# Patient Record
Sex: Male | Born: 1970 | Race: Black or African American | Hispanic: No | Marital: Married | Smoking: Never smoker
Health system: Southern US, Community
[De-identification: ages and names within clinical notes are randomized; demographics above are authoritative.]

## PROBLEM LIST (undated history)

## (undated) DIAGNOSIS — E119 Type 2 diabetes mellitus without complications: Secondary | ICD-10-CM

## (undated) HISTORY — DX: Type 2 diabetes mellitus without complications: E11.9

---

## 2000-09-12 ENCOUNTER — Emergency Department (HOSPITAL_COMMUNITY): Admission: EM | Admit: 2000-09-12 | Discharge: 2000-09-12 | Payer: Self-pay | Admitting: Emergency Medicine

## 2000-09-12 ENCOUNTER — Encounter: Payer: Self-pay | Admitting: Emergency Medicine

## 2008-04-05 ENCOUNTER — Encounter: Admission: RE | Admit: 2008-04-05 | Discharge: 2008-04-05 | Payer: Self-pay | Admitting: Chiropractic Medicine

## 2009-10-22 IMAGING — CR DG LUMBAR SPINE 1V
1 series · 1 of 1 positions shown · non-contrast
Comparison: None

CLINICAL DATA: Lumbago

LUMBAR SPINE - 1 VIEW

[w l-spine lat *]
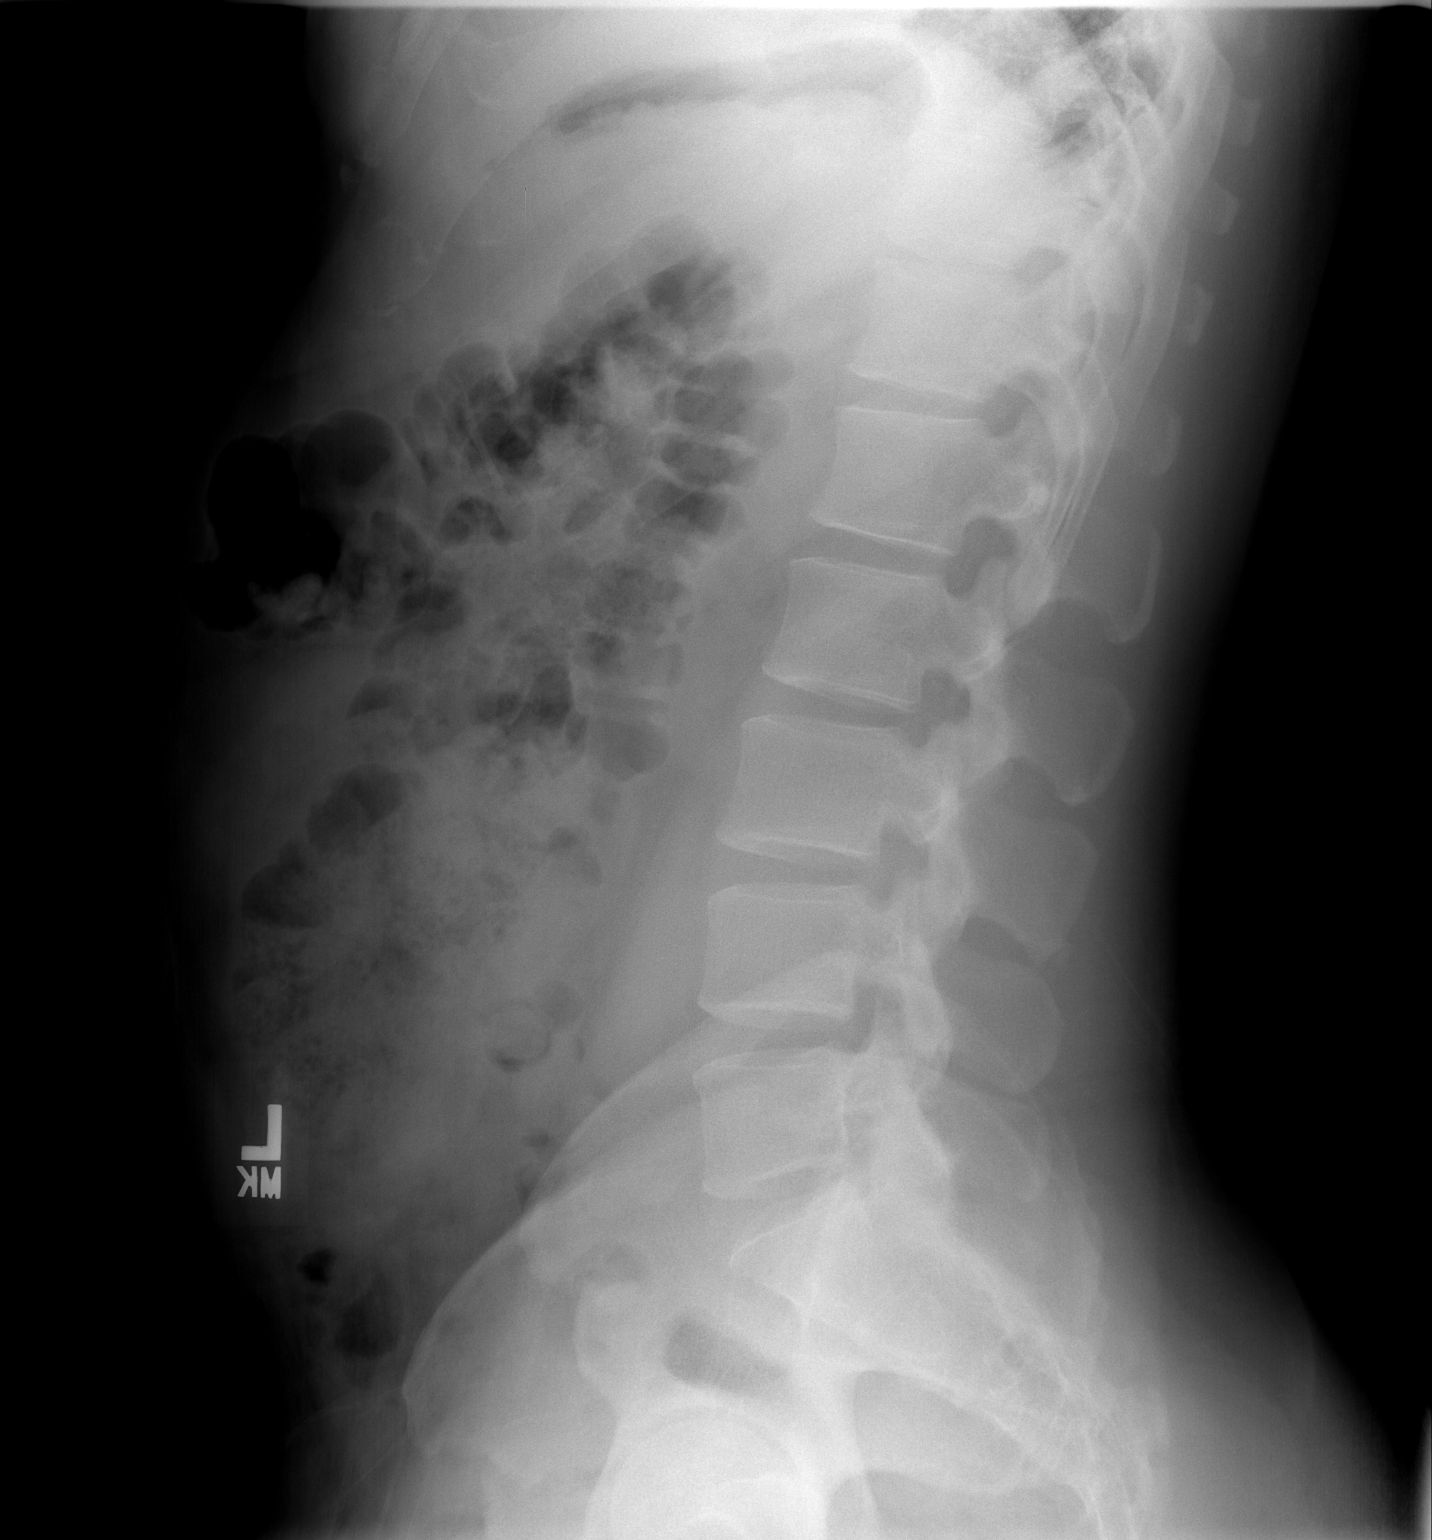

[1 of 1 positions shown; findings below may reference images not displayed]

FINDINGS: A single lateral view shows no subluxation or fractures.
Disc height preserved.  Soft tissues normal.
IMPRESSION: Normal lateral view of the lumbar spine

## 2017-04-29 ENCOUNTER — Encounter: Payer: Medicaid Other | Attending: Nurse Practitioner | Admitting: Dietician

## 2017-04-29 ENCOUNTER — Encounter: Payer: Self-pay | Admitting: Dietician

## 2017-04-29 DIAGNOSIS — Z713 Dietary counseling and surveillance: Secondary | ICD-10-CM | POA: Insufficient documentation

## 2017-04-29 DIAGNOSIS — E119 Type 2 diabetes mellitus without complications: Secondary | ICD-10-CM | POA: Insufficient documentation

## 2017-04-29 NOTE — Patient Instructions (Addendum)
Continue the great changes that you have made!  Continue to choose beverages without carbohydrates.  Continue to stay active.  Aim for 4 Carb Choices per meal (60 grams) +/- 1 either way  Aim for 0-2 Carbs per snack if hungry  Include protein in moderation with your meals and snacks Consider reading food labels for Total Carbohydrate and Fat Grams of foods Consider  increasing your activity level by walking or other things you enjoy for at least 30 minutes daily as tolerated Continue checking BG at alternate times per day as directed by MD  Continue taking medication as directed by MD

## 2017-04-29 NOTE — Progress Notes (Signed)
Diabetes Self-Management Education  Visit Type: First/Initial  Appt. Start Time: 11 Appt. End Time: 1245  04/29/2017  [name], identified by name and date of birth, is a 46 y.o. male with a diagnosis of Diabetes: Type 2.  Patient has been reading about diabetes and made changes.  He would like to know more about healthy eating with diabetes.  He states that his wife has prediabetes.  He has been checking his blood sugar and brought in his log today with numbers in the normal range at this time.  He has been experimenting with his food and then checking his blood sugar to see the effect of his choices.  He lives with his wife and 3 children ages 79,13,11.  He is self employed and does the shopping and cooking.  Most cooking is very simple.  He has been avoiding added salt and canned foods unless he rinses his vegetables.  ASSESSMENT  Height 5\' 7"  (1.702 m), weight 195 lb (88.5 kg). Body mass index is 30.54 kg/m.      Diabetes Self-Management Education - 04/29/17 1124      Visit Information   Visit Type First/Initial     Initial Visit   Diabetes Type Type 1   Are you currently following a meal plan? No   Are you taking your medications as prescribed? Yes   Date Diagnosed 02/2017     Health Coping   How would you rate your overall health? Good     Psychosocial Assessment   Patient Belief/Attitude about Diabetes Motivated to manage diabetes   Self-care barriers None   Self-management support Doctor's office;Family   Other persons present Patient   Patient Concerns Nutrition/Meal planning   Special Needs None   Preferred Learning Style No preference indicated   Learning Readiness Ready   How often do you need to have someone help you when you read instructions, pamphlets, or other written materials from your doctor or pharmacy? 1 - Never   What is the last grade level you completed in school? 2 years college     Pre-Education Assessment   Patient understands the  diabetes disease and treatment process. Needs Instruction   Patient understands incorporating nutritional management into lifestyle. Needs Instruction   Patient undertands incorporating physical activity into lifestyle. Needs Instruction   Patient understands using medications safely. Needs Instruction   Patient understands monitoring blood glucose, interpreting and using results Needs Instruction   Patient understands prevention, detection, and treatment of acute complications. Needs Instruction   Patient understands prevention, detection, and treatment of chronic complications. Needs Instruction   Patient understands how to develop strategies to address psychosocial issues. Needs Instruction   Patient understands how to develop strategies to promote health/change behavior. Needs Instruction     Complications   Last HgB A1C per patient/outside source 14 %  02/2017   How often do you check your blood sugar? 3-4 times/day  but currently ran out of strips   Fasting Blood glucose range (mg/dL) 16-109   Postprandial Blood glucose range (mg/dL) 60-454;098-119   Number of hypoglycemic episodes per month 0   Number of hyperglycemic episodes per week 0   Have you had a dilated eye exam in the past 12 months? Yes   Have you had a dental exam in the past 12 months? No   Are you checking your feet? Yes   How many days per week are you checking your feet? 5     Dietary Intake   Breakfast  egg whites, bacon, OR skips  7:30-8:30   Lunch Salad (vegetables, 1 strip bacon, egg whites, lite ranch) or fish sandwich          11:30-1   Dinner Chili OR spaghetti OR salad OR [place] burgers or hot dog with baked fries  5-6   Snack (evening) peanuts OR cheese its OR dried pineapple OR honey wheat crackers   Beverage(s) water, v-8 juice (vegetable or fruit), half and half tea     Exercise   Exercise Type Light (walking / raking leaves)   How many days per week to you exercise? 3   How many minutes per day  do you exercise? 30   Total minutes per week of exercise 90     Patient Education   Previous Diabetes Education No  Patient states that his mother and mother in law both have diabetes.   Disease state  Definition of diabetes, type 1 and 2, and the diagnosis of diabetes   Nutrition management  Role of diet in the treatment of diabetes and the relationship between the three main macronutrients and blood glucose level;Food label reading, portion sizes and measuring food.;Meal options for control of blood glucose level and chronic complications.;Information on hints to eating out and maintain blood glucose control.;Meal timing in regards to the patients' current diabetes medication.   Physical activity and exercise  Role of exercise on diabetes management, blood pressure control and cardiac health.   Medications Reviewed patients medication for diabetes, action, purpose, timing of dose and side effects.   Monitoring Identified appropriate SMBG and/or A1C goals.;Purpose and frequency of SMBG.;Other (comment)  Problem solving for testing   Acute complications Taught treatment of hypoglycemia - the 15 rule.;Discussed and identified patients' treatment of hyperglycemia.   Chronic complications Relationship between chronic complications and blood glucose control   Psychosocial adjustment Worked with patient to identify barriers to care and solutions   Personal strategies to promote health Lifestyle issues that need to be addressed for better diabetes care     Individualized Goals (developed by patient)   Nutrition General guidelines for healthy choices and portions discussed   Physical Activity Exercise 5-7 days per week;30 minutes per day   Medications take my medication as prescribed   Monitoring  test my blood glucose as discussed   Reducing Risk get labs drawn;increase portions of healthy fats   Health Coping discuss diabetes with (comment)  MD, RD     Post-Education Assessment   Patient  understands the diabetes disease and treatment process. Demonstrates understanding / competency   Patient understands incorporating nutritional management into lifestyle. Needs Review   Patient undertands incorporating physical activity into lifestyle. Needs Review   Patient understands using medications safely. Demonstrates understanding / competency   Patient understands monitoring blood glucose, interpreting and using results Demonstrates understanding / competency   Patient understands prevention, detection, and treatment of acute complications. Demonstrates understanding / competency   Patient understands prevention, detection, and treatment of chronic complications. Demonstrates understanding / competency   Patient understands how to develop strategies to address psychosocial issues. Demonstrates understanding / competency   Patient understands how to develop strategies to promote health/change behavior. Needs Review     Outcomes   Expected Outcomes Demonstrated interest in learning. Expect positive outcomes   Future DMSE 4-6 wks      Individualized Plan for Diabetes Self-Management Training:   Learning Objective:  Patient will have a greater understanding of diabetes self-management. Patient education plan is to attend individual and/or  group sessions per assessed needs and concerns.   Plan:   Patient Instructions  Continue the great changes that you have made!  Continue to choose beverages without carbohydrates.  Continue to stay active.  Aim for 4 Carb Choices per meal (60 grams) +/- 1 either way  Aim for 0-2 Carbs per snack if hungry  Include protein in moderation with your meals and snacks Consider reading food labels for Total Carbohydrate and Fat Grams of foods Consider  increasing your activity level by walking or other things you enjoy for at least 30 minutes daily as tolerated Continue checking BG at alternate times per day as directed by MD  Continue taking  medication as directed by MD      Expected Outcomes:  Demonstrated interest in learning. Expect positive outcomes  Education material provided: Living Well with Diabetes, Food label handouts, A1C conversion sheet, Meal plan card, My Plate and Snack sheet  If problems or questions, patient to contact team via:  Phone  Future DSME appointment: 4-6 wks

## 2017-05-27 ENCOUNTER — Encounter: Payer: Medicaid Other | Attending: Nurse Practitioner | Admitting: Dietician

## 2017-05-27 ENCOUNTER — Encounter: Payer: Self-pay | Admitting: Dietician

## 2017-05-27 DIAGNOSIS — E119 Type 2 diabetes mellitus without complications: Secondary | ICD-10-CM | POA: Insufficient documentation

## 2017-05-27 DIAGNOSIS — Z713 Dietary counseling and surveillance: Secondary | ICD-10-CM | POA: Insufficient documentation

## 2017-05-27 DIAGNOSIS — E118 Type 2 diabetes mellitus with unspecified complications: Secondary | ICD-10-CM

## 2017-05-27 DIAGNOSIS — IMO0002 Reserved for concepts with insufficient information to code with codable children: Secondary | ICD-10-CM

## 2017-05-27 DIAGNOSIS — Z794 Long term (current) use of insulin: Secondary | ICD-10-CM

## 2017-05-27 DIAGNOSIS — E1165 Type 2 diabetes mellitus with hyperglycemia: Secondary | ICD-10-CM

## 2017-05-27 NOTE — Patient Instructions (Signed)
Avoid skipping meals.  Aim for breakfast, lunch, dinner daily. Small snack when you are hungry. Be sure to have carbohydrate and protein with each meal.  Aim for 4 Carb Choices per meal (60 grams) +/- 1 either way  Aim for 0-2 Carbs per snack if hungry  Include protein in moderation with your meals and snacks Consider reading food labels for Total Carbohydrate and Fat Grams of foods Consider  increasing your activity level by walking or other for 30 minutes daily as tolerated Continue checking BG at alternate times per day as directed by MD  Continue taking medication as directed by MD

## 2017-05-27 NOTE — Progress Notes (Signed)
Diabetes Self-Management Education  Visit Type: Follow-up  Appt. Start Time: 1215 Appt. End Time: 1255  05/27/2017  [name], identified by name and date of birth, is a 46 y.o. male with a diagnosis of Diabetes: Type 2.  Patient has been reading about diabetes and made changes.  He would like to know more about healthy eating with diabetes.  He states that his wife has prediabetes.  He has been checking his blood sugar and brought in his log today with numbers in the normal range at this time.  He has been experimenting with his food and then checking his blood sugar to see the effect of his choices.  He lives with his wife and 3 children ages 6217,13,11.  He is self employed and does the shopping and cooking.  Most cooking is very simple.  He has been avoiding added salt and canned foods unless he rinses his vegetables.  Patient was going to bring his wife today but she was not feeling well.  He states that his energy is low and his wife has been encouraging him to eat more carbohydrates.  His diet generally is lower in carbohydrate.  He will cook for the family but will not eat it and just eat a salad.  Review of his blood sugar low indicates overall good control and any highs that patient has, patient is able to determine the cause related to what he was eating and the effect on his blood sugar.  He is taking 25 units of Tresiba q HS and states that it has been reduced.  ASSESSMENT  197 lbs today.  His weight is overall stable.       Diabetes Self-Management Education - 05/27/17 1431      Visit Information   Visit Type Follow-up     Initial Visit   Diabetes Type Type 2   Are you currently following a meal plan? Yes   Are you taking your medications as prescribed? Yes   Date Diagnosed 02/2017     Health Coping   How would you rate your overall health? Good     Psychosocial Assessment   Patient Belief/Attitude about Diabetes Motivated to manage diabetes   Self-care  barriers None   Self-management support Doctor's office;Family   Other persons present Patient   Patient Concerns Nutrition/Meal planning;Glycemic Control   Special Needs None   Preferred Learning Style No preference indicated   Learning Readiness Ready   How often do you need to have someone help you when you read instructions, pamphlets, or other written materials from your doctor or pharmacy? 1 - Never   What is the last grade level you completed in school? 2 years college     Pre-Education Assessment   Patient understands the diabetes disease and treatment process. Demonstrates understanding / competency   Patient understands incorporating nutritional management into lifestyle. Needs Review   Patient undertands incorporating physical activity into lifestyle. Demonstrates understanding / competency   Patient understands using medications safely. Demonstrates understanding / competency   Patient understands monitoring blood glucose, interpreting and using results Demonstrates understanding / competency   Patient understands prevention, detection, and treatment of acute complications. Demonstrates understanding / competency   Patient understands prevention, detection, and treatment of chronic complications. Demonstrates understanding / competency   Patient understands how to develop strategies to address psychosocial issues. Needs Review   Patient understands how to develop strategies to promote health/change behavior. Needs Review     Complications   Last HgB  A1C per patient/outside source 14 %  02/2017   How often do you check your blood sugar? 3-4 times/day   Fasting Blood glucose range (mg/dL) 96-045   Postprandial Blood glucose range (mg/dL) 40-981;191-478   Number of hypoglycemic episodes per month 0   Number of hyperglycemic episodes per week 1   Have you had a dilated eye exam in the past 12 months? Yes     Dietary Intake   Breakfast salad with veges and chees, pear OR egg  whites, fruit, 2 strips bacon and 1/2 cup OJ or milk  skipping breakfast less often.   Snack (morning) none   Lunch salad with protein OR burger king (2 cheeseburgers)  skips lunch more often when he eats breakfast   Snack (afternoon) regular popsicles on occasion   Dinner salad and chlli   Snack (evening) peanuts or cheese or fruit or crackers   Beverage(s) water     Exercise   Exercise Type Light (walking / raking leaves)   How many days per week to you exercise? 6   How many minutes per day do you exercise? 30   Total minutes per week of exercise 180     Patient Education   Previous Diabetes Education Yes (please comment)  2 months ago   Nutrition management  Role of diet in the treatment of diabetes and the relationship between the three main macronutrients and blood glucose level;Food label reading, portion sizes and measuring food.;Meal options for control of blood glucose level and chronic complications.;Information on hints to eating out and maintain blood glucose control.   Physical activity and exercise  Role of exercise on diabetes management, blood pressure control and cardiac health.   Psychosocial adjustment Worked with patient to identify barriers to care and solutions;Role of stress on diabetes;Identified and addressed patients feelings and concerns about diabetes   Personal strategies to promote health Lifestyle issues that need to be addressed for better diabetes care     Individualized Goals (developed by patient)   Nutrition General guidelines for healthy choices and portions discussed   Physical Activity Exercise 5-7 days per week;30 minutes per day   Medications take my medication as prescribed   Monitoring  test my blood glucose as discussed   Problem Solving ways to increase carbohydrates, balanced meals without boredom   Reducing Risk examine blood glucose patterns;get labs drawn;do foot checks daily   Health Coping discuss diabetes with (comment)  MD/RD      Patient Self-Evaluation of Goals - Patient rates self as meeting previously set goals (% of time)   Nutrition >75%   Physical Activity >75%   Medications >75%   Monitoring >75%   Problem Solving 25 - 50%   Reducing Risk 50 - 75 %   Health Coping 50 - 75 %     Post-Education Assessment   Patient understands the diabetes disease and treatment process. Demonstrates understanding / competency   Patient understands incorporating nutritional management into lifestyle. Demonstrates understanding / competency   Patient undertands incorporating physical activity into lifestyle. Demonstrates understanding / competency   Patient understands using medications safely. Demonstrates understanding / competency   Patient understands monitoring blood glucose, interpreting and using results Demonstrates understanding / competency   Patient understands prevention, detection, and treatment of acute complications. Demonstrates understanding / competency   Patient understands prevention, detection, and treatment of chronic complications. Demonstrates understanding / competency   Patient understands how to develop strategies to address psychosocial issues. Demonstrates understanding / competency  Patient understands how to develop strategies to promote health/change behavior. Demonstrates understanding / competency     Outcomes   Expected Outcomes Demonstrated interest in learning. Expect positive outcomes   Future DMSE PRN   Program Status Completed     Subsequent Visit   Since your last visit have you continued or begun to take your medications as prescribed? Yes   Since your last visit have you experienced any weight changes? Gain   Weight Gain (lbs) 2   Since your last visit, are you checking your blood glucose at least once a day? Yes      Individualized Plan for Diabetes Self-Management Training:   Learning Objective:  Patient will have a greater understanding of diabetes self-management. Patient  education plan is to attend individual and/or group sessions per assessed needs and concerns.   Plan:   Patient Instructions  Avoid skipping meals.  Aim for breakfast, lunch, dinner daily. Small snack when you are hungry. Be sure to have carbohydrate and protein with each meal.  Aim for 4 Carb Choices per meal (60 grams) +/- 1 either way  Aim for 0-2 Carbs per snack if hungry  Include protein in moderation with your meals and snacks Consider reading food labels for Total Carbohydrate and Fat Grams of foods Consider  increasing your activity level by walking or other for 30 minutes daily as tolerated Continue checking BG at alternate times per day as directed by MD  Continue taking medication as directed by MD      Expected Outcomes:  Demonstrated interest in learning. Expect positive outcomes  Education material provided: Meal plan card and My Plate, build a balanced meal  If problems or questions, patient to contact team via:  Phone  Future DSME appointment: PRN
